# Patient Record
Sex: Female | Born: 1945 | Race: White | Hispanic: No | Smoking: Never smoker
Health system: Southern US, Community
[De-identification: ages and names within clinical notes are randomized; demographics above are authoritative.]

---

## 2016-04-27 ENCOUNTER — Emergency Department (HOSPITAL_COMMUNITY)
Admission: EM | Admit: 2016-04-27 | Discharge: 2016-04-28 | Disposition: A | Discharge status: Home / Self Care | Payer: No Typology Code available for payment source | Attending: Emergency Medicine | Admitting: Emergency Medicine

## 2016-04-27 ENCOUNTER — Emergency Department (HOSPITAL_COMMUNITY): Payer: No Typology Code available for payment source

## 2016-04-27 ENCOUNTER — Encounter (HOSPITAL_COMMUNITY): Payer: Self-pay

## 2016-04-27 DIAGNOSIS — Y92410 Unspecified street and highway as the place of occurrence of the external cause: Secondary | ICD-10-CM | POA: Diagnosis not present

## 2016-04-27 DIAGNOSIS — Y939 Activity, unspecified: Secondary | ICD-10-CM | POA: Insufficient documentation

## 2016-04-27 DIAGNOSIS — R918 Other nonspecific abnormal finding of lung field: Secondary | ICD-10-CM | POA: Diagnosis not present

## 2016-04-27 DIAGNOSIS — S32019A Unspecified fracture of first lumbar vertebra, initial encounter for closed fracture: Secondary | ICD-10-CM | POA: Diagnosis not present

## 2016-04-27 DIAGNOSIS — R930 Abnormal findings on diagnostic imaging of skull and head, not elsewhere classified: Secondary | ICD-10-CM | POA: Insufficient documentation

## 2016-04-27 DIAGNOSIS — Y999 Unspecified external cause status: Secondary | ICD-10-CM | POA: Insufficient documentation

## 2016-04-27 DIAGNOSIS — S3992XA Unspecified injury of lower back, initial encounter: Secondary | ICD-10-CM | POA: Diagnosis present

## 2016-04-27 LAB — CBC
HEMATOCRIT: 40.9 % (ref 36.0–46.0)
HEMOGLOBIN: 13.7 g/dL (ref 12.0–15.0)
MCH: 31.3 pg (ref 26.0–34.0)
MCHC: 33.5 g/dL (ref 30.0–36.0)
MCV: 93.4 fL (ref 78.0–100.0)
Platelets: 251 10*3/uL (ref 150–400)
RBC: 4.38 MIL/uL (ref 3.87–5.11)
RDW: 13.7 % (ref 11.5–15.5)
WBC: 11.5 10*3/uL — ABNORMAL HIGH (ref 4.0–10.5)

## 2016-04-27 LAB — COMPREHENSIVE METABOLIC PANEL
ALBUMIN: 4.3 g/dL (ref 3.5–5.0)
ALK PHOS: 51 U/L (ref 38–126)
ALT: 30 U/L (ref 14–54)
ANION GAP: 11 (ref 5–15)
AST: 35 U/L (ref 15–41)
BUN: 8 mg/dL (ref 6–20)
CALCIUM: 9.6 mg/dL (ref 8.9–10.3)
CO2: 27 mmol/L (ref 22–32)
Chloride: 102 mmol/L (ref 101–111)
Creatinine, Ser: 0.9 mg/dL (ref 0.44–1.00)
GFR calc Af Amer: >60 mL/min (ref >60)
GFR calc non Af Amer: >60 mL/min (ref >60)
GLUCOSE: 113 mg/dL — AB (ref 65–99)
Potassium: 3.8 mmol/L (ref 3.5–5.1)
SODIUM: 140 mmol/L (ref 135–145)
Total Bilirubin: 0.6 mg/dL (ref 0.3–1.2)
Total Protein: 7.2 g/dL (ref 6.5–8.1)

## 2016-04-27 LAB — I-STAT TROPONIN, ED: TROPONIN I, POC: 0.01 ng/mL (ref 0.00–0.08)

## 2016-04-27 LAB — PROTIME-INR
INR: 0.91
Prothrombin Time: 12.3 seconds (ref 11.4–15.2)

## 2016-04-27 LAB — I-STAT CG4 LACTIC ACID, ED: Lactic Acid, Venous: 1.89 mmol/L (ref 0.5–1.9)

## 2016-04-27 LAB — ETHANOL

## 2016-04-27 MED ORDER — IOPAMIDOL (ISOVUE-300) INJECTION 61%
INTRAVENOUS | Status: AC
  Administered 2016-04-27: 100 mL
  Filled 2016-04-27: qty 100

## 2016-04-27 MED ORDER — FENTANYL CITRATE (PF) 100 MCG/2ML IJ SOLN
50.0000 ug | Freq: Once | INTRAMUSCULAR | Status: AC
  Administered 2016-04-27: 50 ug via INTRAVENOUS
  Filled 2016-04-27: qty 2
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
## 2016-04-28 MED ORDER — MORPHINE SULFATE (PF) 4 MG/ML IV SOLN
4.0000 mg | Freq: Once | INTRAVENOUS | Status: AC
Start: 2016-04-28 — End: 2016-04-28
  Administered 2016-04-28: 4 mg via INTRAVENOUS
  Filled 2016-04-28: qty 1

## 2016-04-28 MED ORDER — TRAMADOL HCL 50 MG PO TABS: 50.0000 mg | ORAL_TABLET | Freq: Two times a day (BID) | ORAL | 0 refills | Status: AC | PRN
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
## 2023-07-08 DEATH — deceased
